# Patient Record
Sex: Female | Born: 1970 | Race: Black or African American | Hispanic: No | Marital: Single | Smoking: Never smoker
Health system: Southern US, Community
[De-identification: ages and names within clinical notes are randomized; demographics above are authoritative.]

## PROBLEM LIST (undated history)

## (undated) DIAGNOSIS — E079 Disorder of thyroid, unspecified: Secondary | ICD-10-CM

## (undated) HISTORY — DX: Disorder of thyroid, unspecified: E07.9

---

## 1999-08-16 ENCOUNTER — Other Ambulatory Visit: Admission: RE | Admit: 1999-08-16 | Discharge: 1999-08-16 | Payer: Self-pay | Admitting: Family Medicine

## 1999-09-18 ENCOUNTER — Encounter (INDEPENDENT_AMBULATORY_CARE_PROVIDER_SITE_OTHER): Payer: Self-pay | Admitting: Specialist

## 1999-09-18 ENCOUNTER — Other Ambulatory Visit: Admission: RE | Admit: 1999-09-18 | Discharge: 1999-09-18 | Payer: Self-pay | Admitting: Gynecology

## 2008-12-21 ENCOUNTER — Emergency Department (HOSPITAL_COMMUNITY): Admission: EM | Admit: 2008-12-21 | Discharge: 2008-12-21 | Payer: Self-pay | Admitting: Emergency Medicine

## 2010-07-18 ENCOUNTER — Emergency Department (HOSPITAL_BASED_OUTPATIENT_CLINIC_OR_DEPARTMENT_OTHER)
Admission: EM | Admit: 2010-07-18 | Discharge: 2010-07-18 | Payer: Self-pay | Source: Home / Self Care | Admitting: Emergency Medicine

## 2010-10-17 LAB — PREGNANCY, URINE: Preg Test, Ur: NEGATIVE

## 2010-11-14 LAB — CBC
HCT: 31.7 % — ABNORMAL LOW (ref 36.0–46.0)
Platelets: 311 10*3/uL (ref 150–400)
WBC: 5 10*3/uL (ref 4.0–10.5)

## 2010-11-14 LAB — POCT CARDIAC MARKERS
CKMB, poc: <1 ng/mL — ABNORMAL LOW (ref 1.0–8.0)
Myoglobin, poc: 35.5 ng/mL (ref 12–200)
Troponin i, poc: <0.05 ng/mL (ref 0.00–0.09)

## 2010-11-14 LAB — DIFFERENTIAL
Eosinophils Relative: 1 % (ref 0–5)
Lymphocytes Relative: 32 % (ref 12–46)
Lymphs Abs: 1.6 10*3/uL (ref 0.7–4.0)
Neutro Abs: 3 10*3/uL (ref 1.7–7.7)

## 2010-11-14 LAB — BASIC METABOLIC PANEL
BUN: 8 mg/dL (ref 6–23)
Calcium: 9.3 mg/dL (ref 8.4–10.5)
GFR calc non Af Amer: >60 mL/min (ref >60)
Potassium: 3.4 mEq/L — ABNORMAL LOW (ref 3.5–5.1)
Sodium: 139 mEq/L (ref 135–145)
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
## 2014-09-07 ENCOUNTER — Ambulatory Visit (INDEPENDENT_AMBULATORY_CARE_PROVIDER_SITE_OTHER): Payer: 59 | Admitting: Physician Assistant

## 2014-09-07 VITALS — BP 118/72 | HR 89 | Temp 98.4°F | Resp 18 | Ht 64.0 in | Wt 178.0 lb

## 2014-09-07 DIAGNOSIS — Z0000 Encounter for general adult medical examination without abnormal findings: Secondary | ICD-10-CM | POA: Diagnosis not present

## 2014-09-07 DIAGNOSIS — Z111 Encounter for screening for respiratory tuberculosis: Secondary | ICD-10-CM

## 2014-09-07 DIAGNOSIS — Z020 Encounter for examination for admission to educational institution: Secondary | ICD-10-CM

## 2014-09-07 DIAGNOSIS — Z23 Encounter for immunization: Secondary | ICD-10-CM | POA: Diagnosis not present

## 2014-09-07 LAB — CBC
HCT: 33.8 % — ABNORMAL LOW (ref 36.0–46.0)
Hemoglobin: 11.3 g/dL — ABNORMAL LOW (ref 12.0–15.0)
MCH: 27.2 pg (ref 26.0–34.0)
MCHC: 33.4 g/dL (ref 30.0–36.0)
MCV: 81.3 fL (ref 78.0–100.0)
MPV: 9.1 fL (ref 8.6–12.4)
Platelets: 342 10*3/uL (ref 150–400)
RBC: 4.16 MIL/uL (ref 3.87–5.11)
RDW: 13.8 % (ref 11.5–15.5)
WBC: 5.7 10*3/uL (ref 4.0–10.5)

## 2014-09-07 LAB — VARICELLA ZOSTER ANTIBODY, IGG: Varicella IgG: >4000 Index — ABNORMAL HIGH (ref <135.00)

## 2014-09-07 LAB — RPR

## 2014-09-07 LAB — HIV ANTIBODY (ROUTINE TESTING W REFLEX): HIV: NONREACTIVE

## 2014-09-07 NOTE — Progress Notes (Signed)

## 2014-09-07 NOTE — Progress Notes (Signed)
09/07/2014 at 10:57 AM  Roney Mans / DOB: [birth year] / MRN: [id number]  The patient  does not have a problem list on file.  SUBJECTIVE  Chief compalaint: Annual Exam   History of present illness: Ms. Bayley is 44 y.o. well appearing female presenting for an annual physical exam. She denies any physical complaints and feels "great." She was diagnosed with hypothyroidism in 1997, and has been taking 112 mcg of Levothyroxin without difficulty.  She denies a family history of breast cancer and has had a negative mammogram in 2015.  She never smoker. She denies a history of hypertension, diabetes.  She does not want to receive the flu shot today.    She  has a past medical history of Thyroid disease.    She has a current medication list which includes the following prescription(s): levothyroxine.  Ms. Little has No Known Allergies. She  reports that she has never smoked. She does not have any smokeless tobacco history on file. She reports that she does not drink alcohol or use illicit drugs. She  has no sexual activity history on file.  The patient  has past surgical history that includes Cesarean section.  Her family history is not on file.  Review of Systems  Respiratory: Negative.   Cardiovascular: Negative.   Gastrointestinal: Negative.   Genitourinary: Negative.   Musculoskeletal: Negative.   Neurological: Negative.     OBJECTIVE  her  height is  (1.626 m) and weight is 178 lb (80.74 kg). Her oral temperature is 98.4 F (36.9 C). Her blood pressure is 118/72 and her pulse is 89. Her respiration is 18 and oxygen saturation is 98%.  The patient's body mass index is 30.54 kg/(m^2).  Physical Exam  Constitutional: She is oriented to person, place, and time. She appears well-developed and well-nourished. No distress.  HENT:  Head: Normocephalic.  Right Ear: Hearing, tympanic membrane and ear canal normal.  Left Ear: Hearing, tympanic membrane, external ear and ear canal  normal.  Nose: Nose normal.  Mouth/Throat: Uvula is midline, oropharynx is clear and moist and mucous membranes are normal.  Eyes: EOM are normal. Pupils are equal, round, and reactive to light.  Neck: Normal range of motion. No thyromegaly present.  Cardiovascular: Normal rate, regular rhythm, normal heart sounds and intact distal pulses.   Respiratory: Effort normal and breath sounds normal.  GI: Soft. Bowel sounds are normal.  Musculoskeletal: Normal range of motion.  Neurological: She is alert and oriented to person, place, and time. She has normal reflexes.  Skin: Skin is warm and dry. She is not diaphoretic.  Psychiatric: She has a normal mood and affect. Her behavior is normal. Judgment and thought content normal.    No results found for this or any previous visit (from the past 24 hour(s)).  ASSESSMENT & PLAN  Krishna was seen today for annual exam.  Diagnoses and associated orders for this visit:  School physical exam: Please provide lab results per patient's request.  She will be back on 2/4 for a TB skin read.    - TB Skin Test - Tdap vaccine greater than or equal to 7yo IM - RPR - HIV antibody - Hepatitis B surface antibody - Hepatitis B surface antigen - Measles/Mumps/Rubella Immunity - Varicella zoster antibody, IgG     The patient was instructed to to call or comeback to clinic as needed, or should symptoms warrant.  Deliah Boston, MHS, PA-C [facility]  09/07/2014 10:57 AM

## 2014-09-13 ENCOUNTER — Telehealth: Payer: Self-pay

## 2014-09-13 NOTE — Telephone Encounter (Signed)
Looks like Hep B Surface Ab was not resulted in EPIC. Called and [facility]. She said that she will result it. Let me know if it doesn't. Thanks

## 2014-09-13 NOTE — Telephone Encounter (Signed)
Please disregard this message. Forgot that the Hep B was [facility].

## 2014-09-14 ENCOUNTER — Encounter: Payer: Self-pay | Admitting: Physician Assistant

## 2014-09-29 ENCOUNTER — Encounter: Payer: Self-pay | Admitting: Physician Assistant

## 2015-02-02 ENCOUNTER — Telehealth: Payer: Self-pay

## 2015-02-02 NOTE — Telephone Encounter (Signed)
Pt called. Had immunizations done here through [facility]. Wants to know if we can pull her chart and fax her MMR and Varicella titer to [phone]

## 2015-02-03 NOTE — Telephone Encounter (Signed)
Tried to call patient back to find out where we are sending records to be sure they are going to a secure location but the phone number in Epic [phone]) said "unavailable" so I left a message at [phone] which is the number listed in her paper chart (DOS 12/06/14). Will wait for return call. Paper chart placed in pending box on MR desk.

## 2015-02-10 NOTE — Telephone Encounter (Signed)
No return phone call from patient. Chart will be filed back.
